# Patient Record
Sex: Male | Born: 1990 | Race: Black or African American | Hispanic: No | Marital: Single | Smoking: Current every day smoker
Health system: Southern US, Community
[De-identification: ages and names within clinical notes are randomized; demographics above are authoritative.]

## PROBLEM LIST (undated history)

## (undated) HISTORY — PX: COLON SURGERY: SHX602

---
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
## 2016-12-27 ENCOUNTER — Other Ambulatory Visit: Payer: Self-pay

## 2016-12-27 ENCOUNTER — Emergency Department
Admission: EM | Admit: 2016-12-27 | Discharge: 2016-12-27 | Disposition: A | Discharge status: Home / Self Care | Payer: Self-pay | Attending: Emergency Medicine | Admitting: Emergency Medicine

## 2016-12-27 ENCOUNTER — Encounter: Payer: Self-pay | Admitting: Emergency Medicine

## 2016-12-27 ENCOUNTER — Emergency Department: Payer: Self-pay

## 2016-12-27 DIAGNOSIS — F17210 Nicotine dependence, cigarettes, uncomplicated: Secondary | ICD-10-CM | POA: Insufficient documentation

## 2016-12-27 DIAGNOSIS — M940 Chondrocostal junction syndrome [Tietze]: Secondary | ICD-10-CM | POA: Insufficient documentation

## 2016-12-27 LAB — TROPONIN I: Troponin I: <0.03 ng/mL

## 2016-12-27 LAB — BASIC METABOLIC PANEL
Anion gap: 6 (ref 5–15)
BUN: 11 mg/dL (ref 6–20)
CALCIUM: 9.6 mg/dL (ref 8.9–10.3)
CHLORIDE: 103 mmol/L (ref 101–111)
CO2: 28 mmol/L (ref 22–32)
CREATININE: 0.84 mg/dL (ref 0.61–1.24)
GFR calc Af Amer: >60 mL/min (ref >60)
GFR calc non Af Amer: >60 mL/min (ref >60)
Glucose, Bld: 86 mg/dL (ref 65–99)
Potassium: 3.8 mmol/L (ref 3.5–5.1)
SODIUM: 137 mmol/L (ref 135–145)

## 2016-12-27 LAB — CBC
HCT: 42.8 % (ref 40.0–52.0)
Hemoglobin: 14.6 g/dL (ref 13.0–18.0)
MCH: 28.3 pg (ref 26.0–34.0)
MCHC: 34.2 g/dL (ref 32.0–36.0)
MCV: 82.9 fL (ref 80.0–100.0)
PLATELETS: 280 10*3/uL (ref 150–440)
RBC: 5.16 MIL/uL (ref 4.40–5.90)
RDW: 13.2 % (ref 11.5–14.5)
WBC: 8.3 10*3/uL (ref 3.8–10.6)

## 2016-12-27 NOTE — ED Notes (Signed)
Pt reporting pain directly to the L of the sternum that is worse with palpation and movement.  Pt denies increased physical activity or strenuous movement at this time.

## 2016-12-27 NOTE — ED Notes (Signed)
Pt verbalizes understanding of discharge instructions.

## 2016-12-27 NOTE — ED Provider Notes (Signed)
[facility] Emergency Department Provider Note  ____________________________________________   First MD Initiated Contact with Patient 12/27/16 2145     (approximate)  I have reviewed the triage vital signs and the nursing notes.   HISTORY  Chief Complaint Chest Pain    HPI Creg Gilmer is a 26 y.o. male who is muscular and healthy at baseline with no chronic medical issues who presents for evaluation of acute onset substernal pain starting today at about 1:00 PM.  He reports that touching the sternum and moving around makes it worse and resting makes it better.  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  He does not regularly lifts weights or workout recently.  He denies fever/chills, shortness of breath, nausea, vomiting, abdominal pain.  He is in no acute distress at this time and is comfortably lying in bed watching TV.  He reports the pain is moderate at worst, particularly with palpation.  He has not had any similar pain in the past.   History reviewed. No pertinent past medical history.  There are no active problems to display for this patient.   Past Surgical History:  Procedure Laterality Date  . COLON SURGERY      Prior to Admission medications   Not on File    Allergies Patient has no known allergies.  No family history on file.  Social History Social History  Substance Use Topics  . Smoking status: Current Every Day Smoker    Types: Cigarettes  . Smokeless tobacco: Never Used  . Alcohol use No    Review of Systems Constitutional: No fever/chills Eyes: No visual changes. ENT: No sore throat. Cardiovascular: Acute central chest/sternal pain, worse with palpation and movement Respiratory: Denies shortness of breath. Gastrointestinal: No abdominal pain.  No nausea, no vomiting.  No diarrhea.  No constipation. Genitourinary: Negative  for dysuria. Musculoskeletal: Negative for neck pain.  Negative for back pain. Integumentary: Negative for rash. Neurological: Negative for headaches, focal weakness or numbness.   ____________________________________________   PHYSICAL EXAM:  VITAL SIGNS: ED Triage Vitals  Enc Vitals Group     BP 12/27/16 1846 133/87     Pulse Rate 12/27/16 1846 80     Resp 12/27/16 1846 16     Temp 12/27/16 1846 98.2 F (36.8 C)     Temp Source 12/27/16 1846 Oral     SpO2 12/27/16 1846 100 %     Weight 12/27/16 1844 81.6 kg (180 lb)     Height 12/27/16 1844 1.676 m (5\' 6" )     Head Circumference --      Peak Flow --      Pain Score 12/27/16 1844 7     Pain Loc --      Pain Edu? --      Excl. in GC? --     Constitutional: Alert and oriented. Well appearing and in no acute distress. Eyes: Conjunctivae are normal.  Cardiovascular: Normal rate, regular rhythm. Good peripheral circulation. Grossly normal heart sounds. Highly reproducible midsternal chest wall tenderness.  No tenderness to palpation of his ribs.  No pleuritic chest pain. Respiratory: Normal respiratory effort.  No retractions. Lungs CTAB. Gastrointestinal: Soft and nontender. No distention.  Musculoskeletal: No lower extremity tenderness nor edema. No gross deformities of extremities. Neurologic:  Normal speech and language. No gross focal neurologic deficits are appreciated.  Skin:  Skin is warm, dry and intact.  No rash noted. Psychiatric: Mood and affect are normal. Speech and behavior are normal.  ____________________________________________   LABS (all labs ordered are listed, but only abnormal results are displayed)  Labs Reviewed  BASIC METABOLIC PANEL  CBC  TROPONIN I   ____________________________________________  EKG  ED ECG REPORT I, Gauge Winski, the attending physician, personally viewed and interpreted this ECG.  Date: 12/27/2016 EKG Time: 18:54 Rate: 72 Rhythm: normal sinus rhythm QRS Axis:  normal Intervals: normal ST/T Wave abnormalities: normal Narrative Interpretation: unremarkable  ____________________________________________  RADIOLOGY   Dg Chest 2 View  Result Date: 12/27/2016 CLINICAL DATA:  26 year old male with history of upper mid chest pain with movement. EXAM: CHEST  2 VIEW COMPARISON:  No priors. FINDINGS: Lung volumes are normal. No consolidative airspace disease. No pleural effusions. No pneumothorax. No pulmonary nodule or mass noted. Pulmonary vasculature and the cardiomediastinal silhouette are within normal limits. IMPRESSION: No radiographic evidence of acute cardiopulmonary disease. Electronically Signed   By: Trudie Reedaniel  Entrikin M.D.   On: 12/27/2016 19:07    ____________________________________________   PROCEDURES  Critical Care performed: No   Procedure(s) performed:   Procedures   ____________________________________________   INITIAL IMPRESSION / ASSESSMENT AND PLAN / ED COURSE  Pertinent labs & imaging results that were available during my care of the patient were reviewed by me and considered in my medical decision making (see chart for details).  Patient's sternal pain is highly reproducible and his only risk factor for cardiac disease is smoking.  He is otherwise very healthy and extremely muscular.  I stress the costochondritis and I had my usual customary discussion with the patient about costochondritis versus musculoskeletal strain.  I explained why do not believe he is suffering from an acute or emergent medical condition at this time.  I gave my recommendations for ibuprofen 600 mg 3 times a day with meals and I provided information about PCP follow-up and return precautions.  He understands and agrees with the plan.      ____________________________________________  FINAL CLINICAL IMPRESSION(S) / ED DIAGNOSES  Final diagnoses:  Acute costochondritis     MEDICATIONS GIVEN DURING THIS VISIT:  Medications - No data to  display   NEW OUTPATIENT MEDICATIONS STARTED DURING THIS VISIT:  There are no discharge medications for this patient.   There are no discharge medications for this patient.   There are no discharge medications for this patient.    Note:  This document was prepared using Dragon voice recognition software and may include unintentional dictation errors.    Loleta RoseForbach, Britanny Marksberry, MD 12/27/16 2256

## 2016-12-27 NOTE — Discharge Instructions (Signed)

## 2016-12-27 NOTE — ED Triage Notes (Signed)
C/O upper mid chest pain with movement.  Onset of symptoms at 1300.
